# Patient Record
Sex: Male | Born: 2000 | Race: White | Hispanic: No | Marital: Single | Smoking: Never smoker
Health system: Southern US, Community
[De-identification: ages and names within clinical notes are randomized; demographics above are authoritative.]

## PROBLEM LIST (undated history)

## (undated) ENCOUNTER — Ambulatory Visit: Admission: EM | Payer: BLUE CROSS/BLUE SHIELD

## (undated) HISTORY — PX: TONSILLECTOMY: SUR1361

---
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
## 2020-05-15 IMAGING — CR DG CHEST 2V
2 series · 2 of 2 positions shown · non-contrast
Comparison: None.

CLINICAL DATA: Short of breath, wheezing for 2-3 days

EXAM:
CHEST - 2 VIEW

[chest pa]
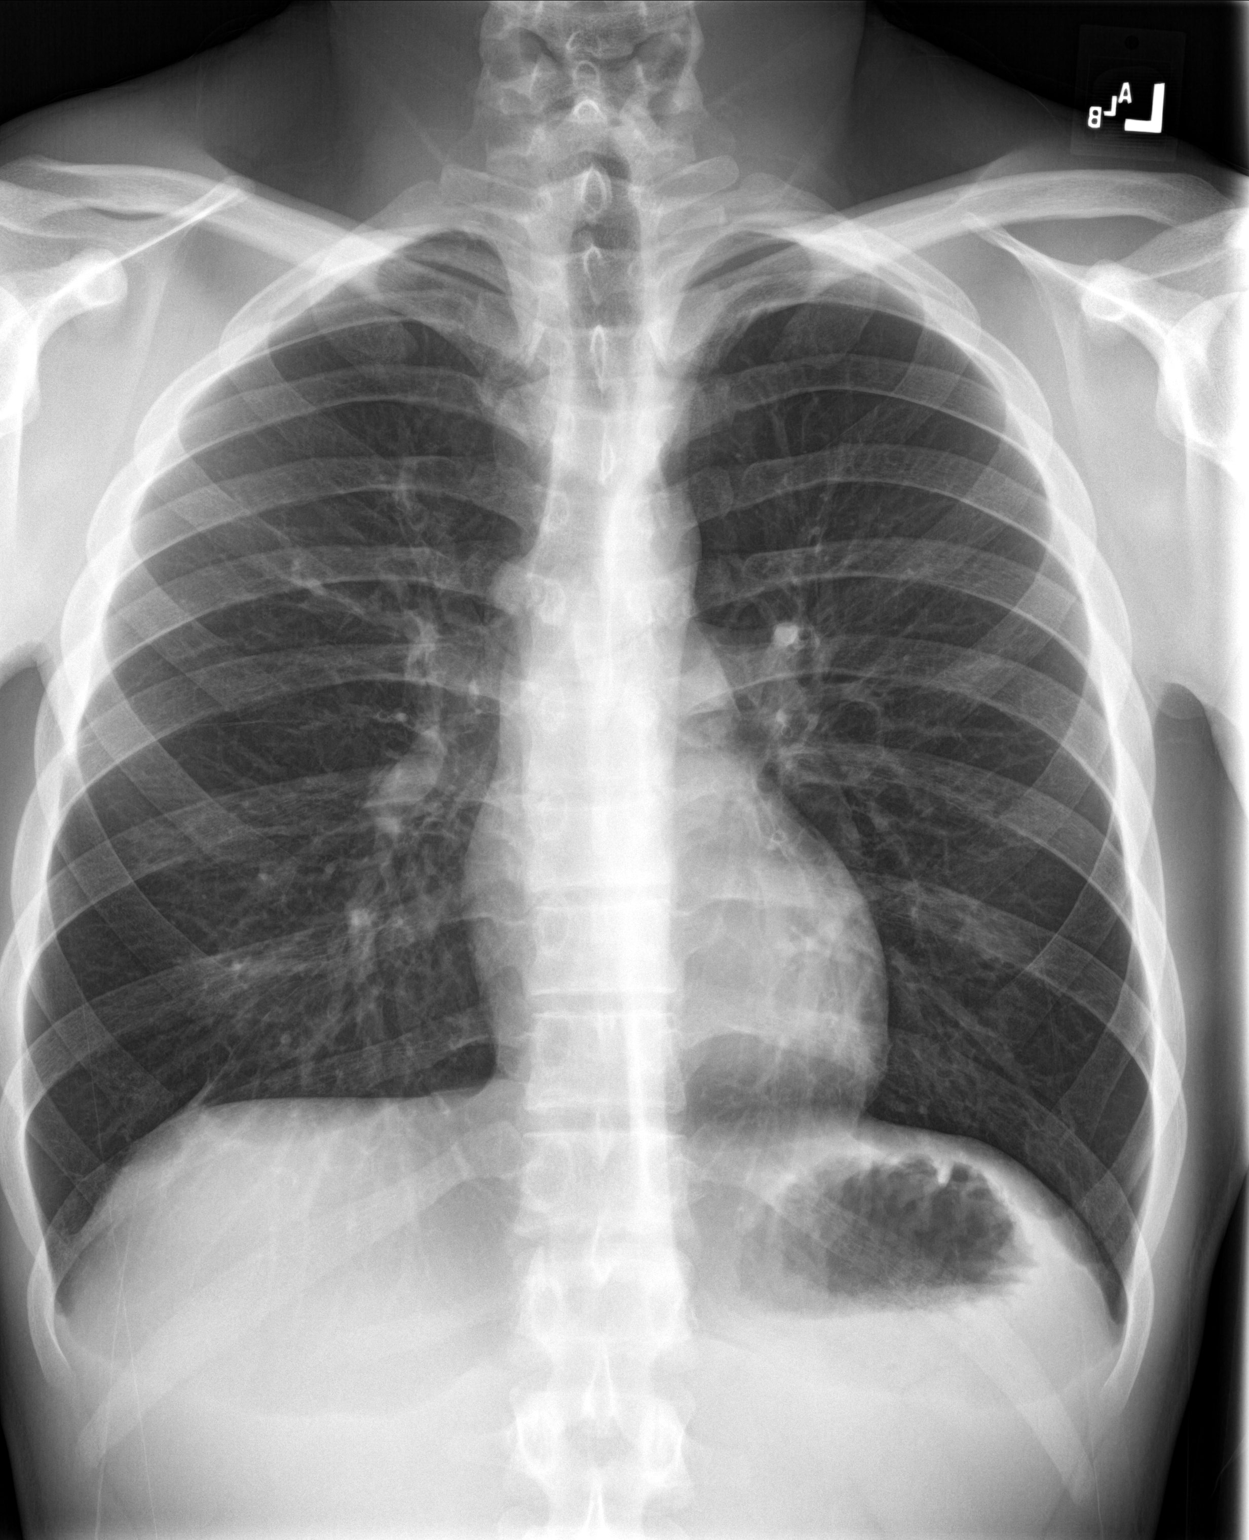

[chest lat]
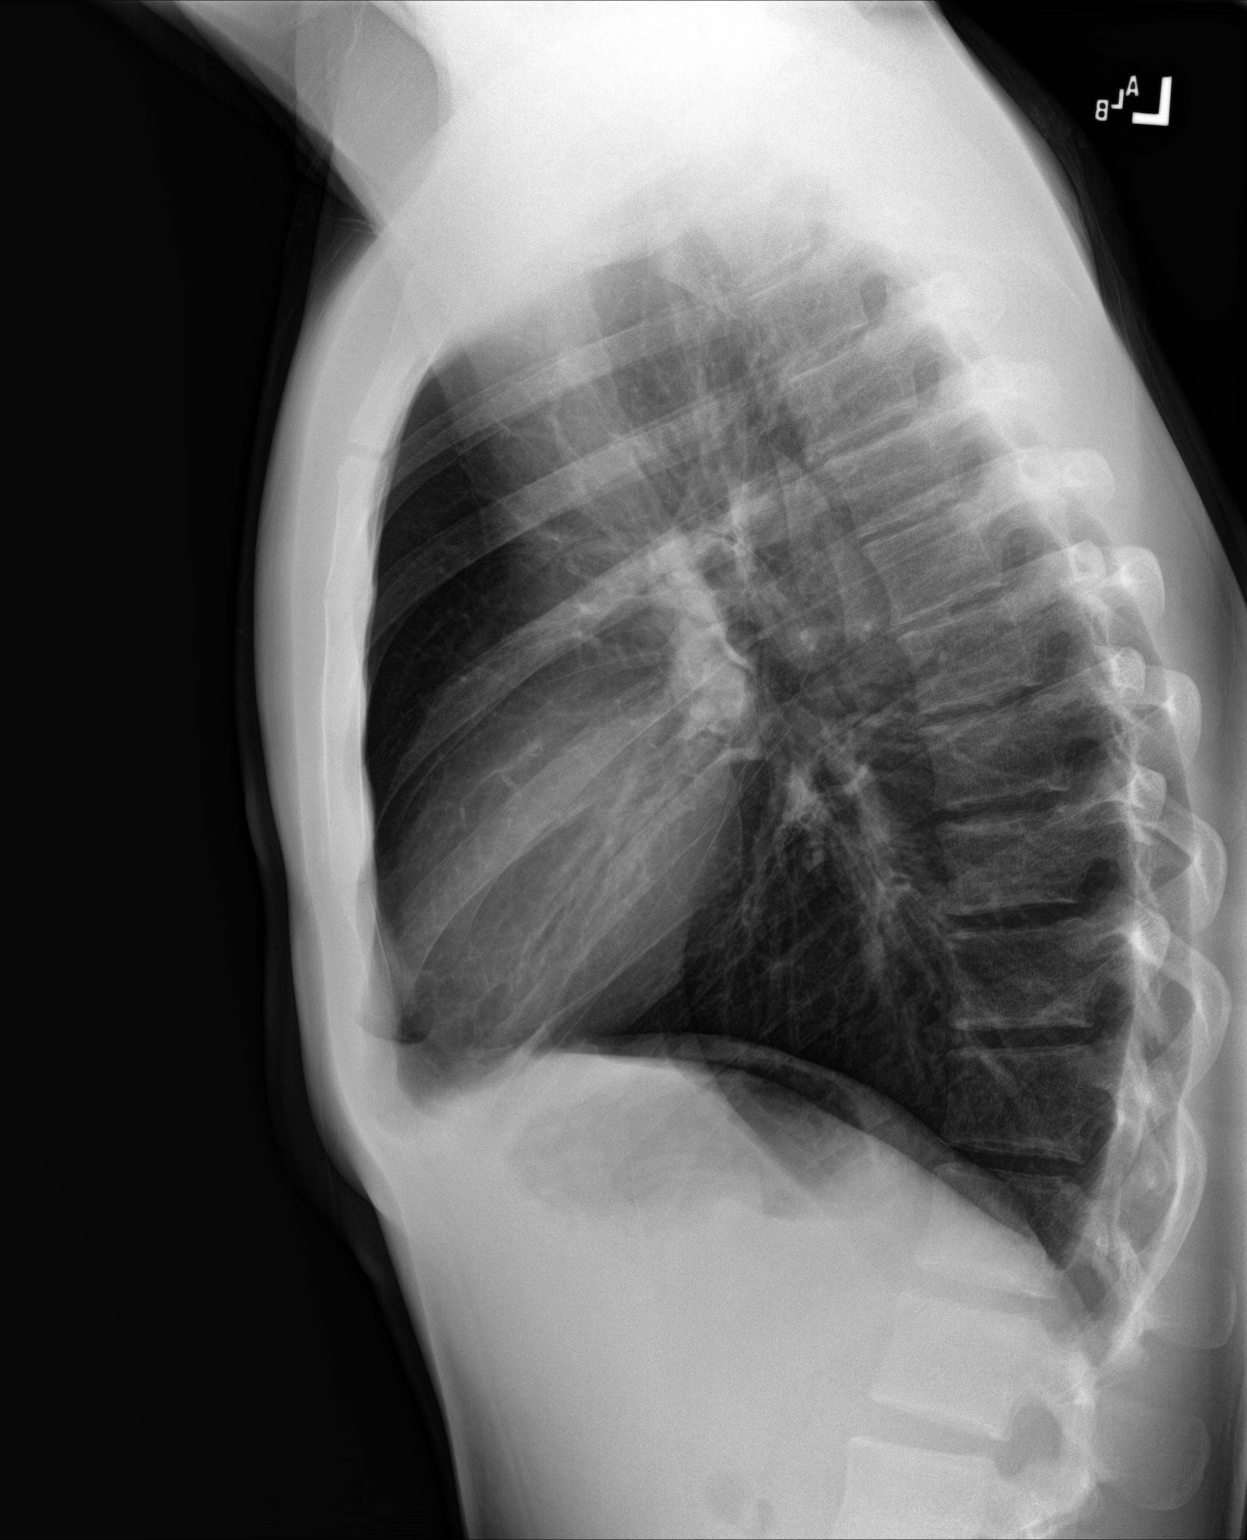

[2 of 2 positions shown; findings below may reference images not displayed]

FINDINGS: Normal mediastinum and cardiac silhouette. Normal pulmonary
vasculature. No evidence of effusion, infiltrate, or pneumothorax.
No acute bony abnormality.
IMPRESSION: Normal chest radiograph.

## 2020-11-06 ENCOUNTER — Other Ambulatory Visit: Payer: Self-pay

## 2020-11-06 DIAGNOSIS — Z5321 Procedure and treatment not carried out due to patient leaving prior to being seen by health care provider: Secondary | ICD-10-CM | POA: Diagnosis not present

## 2020-11-06 DIAGNOSIS — R197 Diarrhea, unspecified: Secondary | ICD-10-CM | POA: Insufficient documentation

## 2020-11-06 DIAGNOSIS — R110 Nausea: Secondary | ICD-10-CM | POA: Diagnosis not present

## 2020-11-06 DIAGNOSIS — R1030 Lower abdominal pain, unspecified: Secondary | ICD-10-CM | POA: Diagnosis present

## 2020-11-06 DIAGNOSIS — K5900 Constipation, unspecified: Secondary | ICD-10-CM | POA: Insufficient documentation

## 2020-11-06 LAB — URINALYSIS, COMPLETE (UACMP) WITH MICROSCOPIC
Bacteria, UA: NONE SEEN
Bilirubin Urine: NEGATIVE
Glucose, UA: NEGATIVE mg/dL
Hgb urine dipstick: NEGATIVE
Ketones, ur: 5 mg/dL — AB
Leukocytes,Ua: NEGATIVE
Nitrite: NEGATIVE
Protein, ur: NEGATIVE mg/dL
Specific Gravity, Urine: 1.028 (ref 1.005–1.030)
Squamous Epithelial / HPF: NONE SEEN (ref 0–5)
pH: 5 (ref 5.0–8.0)

## 2020-11-06 LAB — CBC
HCT: 41.1 % (ref 39.0–52.0)
Hemoglobin: 13.7 g/dL (ref 13.0–17.0)
MCH: 26.2 pg (ref 26.0–34.0)
MCHC: 33.3 g/dL (ref 30.0–36.0)
MCV: 78.6 fL — ABNORMAL LOW (ref 80.0–100.0)
Platelets: 329 10*3/uL (ref 150–400)
RBC: 5.23 MIL/uL (ref 4.22–5.81)
RDW: 13.1 % (ref 11.5–15.5)
WBC: 11.7 10*3/uL — ABNORMAL HIGH (ref 4.0–10.5)
nRBC: 0 % (ref 0.0–0.2)

## 2020-11-06 NOTE — ED Triage Notes (Signed)
Pt in with co abd pain with watery stools since Monday. Pt has nausea but no vomiting. Last BM Saturday, hx of constipation in the past. Pt denies any dysuria or fever. Pt co pain to mid lower abd.

## 2020-11-07 ENCOUNTER — Other Ambulatory Visit: Payer: Self-pay

## 2020-11-07 ENCOUNTER — Emergency Department
Admission: EM | Admit: 2020-11-07 | Discharge: 2020-11-07 | Disposition: A | Discharge status: Home / Self Care | Payer: BLUE CROSS/BLUE SHIELD | Attending: Emergency Medicine | Admitting: Emergency Medicine

## 2020-11-07 LAB — LIPASE, BLOOD: Lipase: 50 U/L (ref 11–51)

## 2020-11-07 LAB — COMPREHENSIVE METABOLIC PANEL
ALT: 18 U/L (ref 0–44)
AST: 25 U/L (ref 15–41)
Albumin: 4.8 g/dL (ref 3.5–5.0)
Alkaline Phosphatase: 53 U/L (ref 38–126)
Anion gap: 7 (ref 5–15)
BUN: 15 mg/dL (ref 6–20)
CO2: 26 mmol/L (ref 22–32)
Calcium: 9.4 mg/dL (ref 8.9–10.3)
Chloride: 104 mmol/L (ref 98–111)
Creatinine, Ser: 1.17 mg/dL (ref 0.61–1.24)
GFR, Estimated: >60 mL/min (ref >60)
Glucose, Bld: 104 mg/dL — ABNORMAL HIGH (ref 70–99)
Potassium: 3.4 mmol/L — ABNORMAL LOW (ref 3.5–5.1)
Sodium: 137 mmol/L (ref 135–145)
Total Bilirubin: 0.6 mg/dL (ref 0.3–1.2)
Total Protein: 7.8 g/dL (ref 6.5–8.1)
